# Patient Record
Sex: Male | Born: 2000 | Race: White | Hispanic: No | Marital: Single
Health system: Southern US, Community
[De-identification: ages and names within clinical notes are randomized; demographics above are authoritative.]

## PROBLEM LIST (undated history)

## (undated) DIAGNOSIS — K921 Melena: Secondary | ICD-10-CM

## (undated) DIAGNOSIS — S62102A Fracture of unspecified carpal bone, left wrist, initial encounter for closed fracture: Secondary | ICD-10-CM

## (undated) HISTORY — DX: Melena: K92.1

## (undated) HISTORY — DX: Fracture of unspecified carpal bone, left wrist, initial encounter for closed fracture: S62.102A

---
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
## 2019-03-02 ENCOUNTER — Ambulatory Visit (INDEPENDENT_AMBULATORY_CARE_PROVIDER_SITE_OTHER): Payer: Managed Care, Other (non HMO) | Admitting: Family Medicine

## 2019-03-02 ENCOUNTER — Other Ambulatory Visit: Payer: Managed Care, Other (non HMO)

## 2019-03-02 ENCOUNTER — Encounter: Payer: Self-pay | Admitting: Family Medicine

## 2019-03-02 ENCOUNTER — Telehealth: Payer: Self-pay | Admitting: *Deleted

## 2019-03-02 ENCOUNTER — Other Ambulatory Visit: Payer: Self-pay

## 2019-03-02 VITALS — BP 150/82 | Temp 98.1°F | Ht 70.0 in | Wt 211.0 lb

## 2019-03-02 DIAGNOSIS — Z20822 Contact with and (suspected) exposure to covid-19: Secondary | ICD-10-CM | POA: Insufficient documentation

## 2019-03-02 DIAGNOSIS — Z20828 Contact with and (suspected) exposure to other viral communicable diseases: Secondary | ICD-10-CM

## 2019-03-02 DIAGNOSIS — M25532 Pain in left wrist: Secondary | ICD-10-CM | POA: Diagnosis not present

## 2019-03-02 NOTE — Assessment & Plan Note (Signed)
Doesn't quite meet close exposure criteria however work is requesting he be tested. Will refer for testing. Discussed if exposed, regardless CDC recommendation is home quarantine for full 2 weeks from date of last contact. He will discuss this at work.

## 2019-03-02 NOTE — Progress Notes (Signed)
This visit was conducted in person.  BP (!) 150/82   Temp 98.1 F (36.7 C)   Ht '5\' 10"'$  (1.778 m)   Wt 211 lb (95.7 kg)   BMI 30.28 kg/m    CC: L wrist pain Subjective:    Patient ID: Scott Ellis, male    DOB: 2001-01-24, 18 y.o.   MRN: [id number]  HPI: Scott Ellis is a 18 y.o. male presenting on 03/02/2019 for No chief complaint on file.   Last seen 12/2013.   Just found out he was exposed to 2 Covid19 positive persons at work. Patient was seen in the parking lot. States he was intermittently around them, but not directly in contact, and does use mask regularly. Denies Covid symptoms at this time. Last exposure was Friday June 12th.   L hand dominant.   1 wk h/o L wrist pain, denies inciting trauma/injury or falls. Some tingling of left wrist noted last night. Feels wrist stays stiff. Painful to palpation radial wrist into base of 2nd metacarpal, worse with repetitive use.   Works at [employer], currently Quarry manager. Works with hands, repetitive movements at work.   H/o remote L wrist fracture that healed well. No problems since.  BP elevated today - attributed to concern over covid19 status.      Relevant past medical, surgical, family and social history reviewed and updated as indicated. Interim medical history since our last visit reviewed. Allergies and medications reviewed and updated. Outpatient Medications Prior to Visit  Medication Sig Dispense Refill  . ibuprofen (ADVIL,MOTRIN) 100 MG/5ML suspension 3 tablespoons every six hours     No facility-administered medications prior to visit.      Per HPI unless specifically indicated in ROS section below Review of Systems Objective:    BP (!) 150/82   Temp 98.1 F (36.7 C)   Ht '5\' 10"'$  (1.778 m)   Wt 211 lb (95.7 kg)   BMI 30.28 kg/m   Wt Readings from Last 3 Encounters:  03/02/19 211 lb (95.7 kg) (95 %, Z= 1.69)*  12/19/13 197 lb 12 oz (89.7 kg) (>99 %, Z= 2.39)*  08/25/11 144 lb 8 oz (65.5 kg) (98 %, Z=  2.01)*   * Growth percentiles are based on CDC (Boys, 2-20 Years) data.    Physical Exam Vitals signs and nursing note reviewed.  Constitutional:      Appearance: Normal appearance.  Musculoskeletal: Normal range of motion.        General: Tenderness present. No swelling or deformity.     Comments:  2+ rad pulses bilaterally R wrist WNL L wrist - FROM in flexion/extension and radial/ulnar deviation, no erythema or edema, mild-mod discomfort to palpation at base of 2nd MC and at dorsal radial carpals, reproducible tenderness with resisted wrist extension, no pain at TFCC   Skin:    General: Skin is warm and dry.     Capillary Refill: Capillary refill takes less than 2 seconds.     Findings: No erythema or rash.  Neurological:     Mental Status: He is alert.       Assessment & Plan:   Problem List Items Addressed This Visit    Exposure to Covid-19 Virus    Doesn't quite meet close exposure criteria however work is requesting he be tested. Will refer for testing. Discussed if exposed, regardless CDC recommendation is home quarantine for full 2 weeks from date of last contact. He will discuss this at work.       Acute  pain of left wrist - Primary    Anticipate L dominant wrist sprain vs wrist extensor tendonitis. rec avoid repetitive motions at work, discussed NSAID use, rec wrist brace use for next 2-3 wks. Update if not improving with treatment. Pt agrees with plan.           No orders of the defined types were placed in this encounter.  No orders of the defined types were placed in this encounter.   Follow up plan: Return if symptoms worsen or fail to improve.  Ria Bush, MD

## 2019-03-02 NOTE — Patient Instructions (Addendum)
Regarding recent Covid19 exposure - we will call you to schedule testing. CDC guidelines recommend home quarantine for 14 days from date of last exposure. Talk with work about their recommendations.  I think you have left wrist sprain - buy thumb spica wrist brace for left hand to use at day and/or night for the next 2 weeks. Limit repetitive use of left dominant hand for next 2 weeks. Take advil (ibuprofen) 400mg  twice daily with meals for the next 5-7 days then as needed. Let us know if not improving with treatment.

## 2019-03-02 NOTE — Telephone Encounter (Signed)
Pt returned call and has been scheduled for today at the [facility] in [place]. He was advised that this is a drive thru test site and he should have a mask on, stay in the car with windows rolled up until ready to be tested.  Pt voiced understanding.

## 2019-03-02 NOTE — Telephone Encounter (Signed)
-----   Message from Ria Bush, MD sent at 03/02/2019  9:26 AM EDT ----- Regarding: covid testing Good morning, I would like patient tested for positive Covid exposure at work, work is requesting testing. Thanks, Garlon Hatchet

## 2019-03-02 NOTE — Telephone Encounter (Signed)
Left voicemail to call [phone] M-F 7am-7pm to schedule a time for covid testing. Order placed in epic for when they return call

## 2019-03-02 NOTE — Assessment & Plan Note (Signed)
Anticipate L dominant wrist sprain vs wrist extensor tendonitis. rec avoid repetitive motions at work, discussed NSAID use, rec wrist brace use for next 2-3 wks. Update if not improving with treatment. Pt agrees with plan.

## 2019-03-04 LAB — NOVEL CORONAVIRUS, NAA: SARS-CoV-2, NAA: NOT DETECTED

## 2019-08-16 ENCOUNTER — Telehealth: Payer: Managed Care, Other (non HMO) | Admitting: Physician Assistant

## 2019-08-16 ENCOUNTER — Encounter (INDEPENDENT_AMBULATORY_CARE_PROVIDER_SITE_OTHER): Payer: Self-pay

## 2019-08-16 ENCOUNTER — Telehealth: Payer: Self-pay

## 2019-08-16 DIAGNOSIS — R481 Agnosia: Secondary | ICD-10-CM

## 2019-08-16 DIAGNOSIS — R509 Fever, unspecified: Secondary | ICD-10-CM

## 2019-08-16 NOTE — Progress Notes (Signed)
E-Visit for Corona Virus Screening   Your current symptoms could be consistent with the coronavirus.  Many health care providers can now test patients at their office but not all are.  [place] [facility] has multiple testing sites - you do not need an appointment or a lab order. For information on our COVID testing locations and hours go to [email or web address]  Please quarantine yourself while awaiting your test results.  We are enrolling you in our MyChart Home [facility] for COVID19 . Daily you will receive a questionnaire within the MyChart website. Our COVID 19 response team willl be monitoriing your responses daily. Please continue good preventive care measures, including:  frequent hand-washing, avoid touching your face, cover coughs/sneezes, stay out of crowds and keep a 6 foot distance from others.    COVID-19 is a respiratory illness with symptoms that are similar to the flu. Symptoms are typically mild to moderate, but there have been cases of severe illness and death due to the virus. The following symptoms may appear 2-14 days after exposure: . Fever . Cough . Shortness of breath or difficulty breathing . Chills . Repeated shaking with chills . Muscle pain . Headache . Sore throat . New loss of taste or smell . Fatigue . Congestion or runny nose . Nausea or vomiting . Diarrhea  If you develop fever/cough/breathlessness, please stay home for 10 days with improving symptoms and until you have had 24 hours of no fever (without taking a fever reducer).  Go to the nearest hospital ED for assessment if fever/cough/breathlessness are severe or illness seems like a threat to life.  It is vitally important that if you feel that you have an infection such as this virus or any other virus that you stay home and away from places where you may spread it to others.  You should avoid contact with people age 25 and older.   You should wear a mask or cloth face covering over  your nose and mouth if you must be around other people or animals, including pets (even at home). Try to stay at least 6 feet away from other people. This will protect the people around you.  You may also take acetaminophen (Tylenol) as needed for fever.   Stay well hydrated. Drink enough water and fluids to keep your urine clear or pale yellow. Get lost of rest. .   Advil or ibuprofen for pain. Do not take Aspirin.   - Cepacol throat lozenges. - Gargle with 8 oz of salt water ( tsp of salt per 1 qt of water) as often as every 1-2 hours to soothe your throat. - For sore throat try using a honey-based tea. Use 3 teaspoons of honey with juice squeezed from half lemon. Place shaved pieces of ginger into 1/2-1 cup of water and warm over stove top. Then mix the ingredients and repeat every 4 hours as needed.   Reduce your risk of any infection by using the same precautions used for avoiding the common cold or flu:  Marland Kitchen Wash your hands often with soap and warm water for at least 20 seconds.  If soap and water are not readily available, use an alcohol-based hand sanitizer with at least 60% alcohol.  . If coughing or sneezing, cover your mouth and nose by coughing or sneezing into the elbow areas of your shirt or coat, into a tissue or into your sleeve (not your hands). . Avoid shaking hands with others and consider head nods or verbal greetings only. Marland Kitchen  Avoid touching your eyes, nose, or mouth with unwashed hands.  . Avoid close contact with people who are sick. . Avoid places or events with large numbers of people in one location, like concerts or sporting events. . Carefully consider travel plans you have or are making. . If you are planning any travel outside or inside the [place], visit the CDC's Travelers' Health webpage for the latest health notices. . If you have some symptoms but not all symptoms, continue to monitor at home and seek medical attention if your symptoms worsen. . If you are having a  medical emergency, call 911.  HOME CARE . Only take medications as instructed by your medical team. . Drink plenty of fluids and get plenty of rest. . A steam or ultrasonic humidifier can help if you have congestion.   GET HELP RIGHT AWAY IF YOU HAVE EMERGENCY WARNING SIGNS** FOR COVID-19. If you or someone is showing any of these signs seek emergency medical care immediately. Call 911 or proceed to your closest emergency facility if: . You develop worsening high fever. . Trouble breathing . Bluish lips or face . Persistent pain or pressure in the chest . New confusion . Inability to wake or stay awake . You cough up blood. . Your symptoms become more severe  **This list is not all possible symptoms. Contact your medical provider for any symptoms that are sever or concerning to you.   MAKE SURE YOU   Understand these instructions.  Will watch your condition.  Will get help right away if you are not doing well or get worse.  Your e-visit answers were reviewed by a board certified advanced clinical practitioner to complete your personal care plan.  Depending on the condition, your plan could have included both over the counter or prescription medications.  If there is a problem please reply once you have received a response from your provider.  Your safety is important to [place].  If you have drug allergies check your prescription carefully.    You can use MyChart to ask questions about today's visit, request a non-urgent call back, or ask for a work or school excuse for 24 hours related to this e-Visit. If it has been greater than 24 hours you will need to follow up with your provider, or enter a new e-Visit to address those concerns. You will get an e-mail in the next two days asking about your experience.  I hope that your e-visit has been valuable and will speed your recovery. Thank you for using e-visits.   Greater than 5 minutes, yet less than 10 minutes of time have been spent  researching, coordinating and implementing care for this patient today.

## 2019-08-16 NOTE — Telephone Encounter (Signed)
Left a vm for patient to callback regarding mychart questionnaire  

## 2019-08-17 ENCOUNTER — Encounter (INDEPENDENT_AMBULATORY_CARE_PROVIDER_SITE_OTHER): Payer: Self-pay

## 2019-08-18 ENCOUNTER — Encounter (INDEPENDENT_AMBULATORY_CARE_PROVIDER_SITE_OTHER): Payer: Self-pay

## 2019-08-20 ENCOUNTER — Encounter: Payer: Self-pay | Admitting: Family Medicine

## 2019-08-20 ENCOUNTER — Encounter (INDEPENDENT_AMBULATORY_CARE_PROVIDER_SITE_OTHER): Payer: Self-pay

## 2019-08-22 ENCOUNTER — Encounter (INDEPENDENT_AMBULATORY_CARE_PROVIDER_SITE_OTHER): Payer: Self-pay

## 2019-08-23 ENCOUNTER — Encounter (INDEPENDENT_AMBULATORY_CARE_PROVIDER_SITE_OTHER): Payer: Self-pay

## 2019-08-26 ENCOUNTER — Encounter (INDEPENDENT_AMBULATORY_CARE_PROVIDER_SITE_OTHER): Payer: Self-pay

## 2019-12-19 ENCOUNTER — Other Ambulatory Visit: Payer: Self-pay

## 2019-12-19 ENCOUNTER — Ambulatory Visit: Payer: Managed Care, Other (non HMO) | Attending: Internal Medicine

## 2019-12-19 DIAGNOSIS — Z23 Encounter for immunization: Secondary | ICD-10-CM

## 2019-12-19 NOTE — Progress Notes (Signed)
   Covid-19 Vaccination Clinic  Name:  Scott Ellis    MRN: [id number] DOB: [birth year]  12/19/2019  Mr. Scott Ellis was observed post Covid-19 immunization for 15 minutes without incident. He was provided with Vaccine Information Sheet and instruction to access the V-Safe system.   Mr. Scott Ellis was instructed to call 911 with any severe reactions post vaccine: Marland Kitchen Difficulty breathing  . Swelling of face and throat  . A fast heartbeat  . A bad rash all over body  . Dizziness and weakness   Immunizations Administered    Name Date Dose VIS Date Route   Pfizer COVID-19 Vaccine 12/19/2019 12:44 PM 0.3 mL 08/26/2019 Intramuscular   Manufacturer: [employer], [employer]   Lot: [id number]   NDC: [address]-0

## 2020-01-11 ENCOUNTER — Ambulatory Visit: Payer: Managed Care, Other (non HMO) | Attending: Internal Medicine

## 2020-01-11 DIAGNOSIS — Z23 Encounter for immunization: Secondary | ICD-10-CM

## 2020-01-11 NOTE — Progress Notes (Signed)
   Covid-19 Vaccination Clinic  Name:  Scott Ellis    MRN: [id number] DOB: [birth year]  01/11/2020  [name] was observed post Covid-19 immunization for 15 minutes without incident. He was provided with Vaccine Information Sheet and instruction to access the V-Safe system.   [name] was instructed to call 911 with any severe reactions post vaccine: Marland Kitchen Difficulty breathing  . Swelling of face and throat  . A fast heartbeat  . A bad rash all over body  . Dizziness and weakness   Immunizations Administered    Name Date Dose VIS Date Route   Pfizer COVID-19 Vaccine 01/11/2020 12:13 PM 0.3 mL 11/09/2018 Intramuscular   Manufacturer: [employer], [employer]   Lot: [id number]   NDC: [address]-7
